# Patient Record
Sex: Female | Born: 1965 | Race: White | Hispanic: No | Smoking: Current every day smoker
Health system: Southern US, Community
[De-identification: ages and names within clinical notes are randomized; demographics above are authoritative.]

## PROBLEM LIST (undated history)

## (undated) DIAGNOSIS — F32A Depression, unspecified: Secondary | ICD-10-CM

## (undated) DIAGNOSIS — F419 Anxiety disorder, unspecified: Secondary | ICD-10-CM

## (undated) DIAGNOSIS — F329 Major depressive disorder, single episode, unspecified: Secondary | ICD-10-CM

## (undated) HISTORY — DX: Anxiety disorder, unspecified: F41.9

## (undated) HISTORY — DX: Depression, unspecified: F32.A

## (undated) HISTORY — DX: Major depressive disorder, single episode, unspecified: F32.9

## (undated) HISTORY — PX: APPENDECTOMY: SHX54

---

## 2000-02-29 ENCOUNTER — Emergency Department (HOSPITAL_COMMUNITY): Admission: EM | Admit: 2000-02-29 | Discharge: 2000-02-29 | Payer: Self-pay | Admitting: Emergency Medicine

## 2001-04-19 ENCOUNTER — Emergency Department (HOSPITAL_COMMUNITY): Admission: EM | Admit: 2001-04-19 | Discharge: 2001-04-19 | Payer: Self-pay | Admitting: Emergency Medicine

## 2010-04-14 ENCOUNTER — Ambulatory Visit: Payer: Self-pay | Admitting: Emergency Medicine

## 2010-04-14 DIAGNOSIS — J019 Acute sinusitis, unspecified: Secondary | ICD-10-CM

## 2010-04-16 ENCOUNTER — Telehealth (INDEPENDENT_AMBULATORY_CARE_PROVIDER_SITE_OTHER): Payer: Self-pay

## 2010-06-05 NOTE — Assessment & Plan Note (Signed)
Summary: EARS/SINUS INFECTION/ NH   Vital Signs:  Patient Profile:   45 Years Old Female CC:      ear ache Height:     62 inches Weight:      146 pounds O2 Sat:      99 % O2 treatment:    Room Air Temp:     98.6 degrees F oral Pulse rate:   96 / minute Resp:     16 per minute BP sitting:   125 / 76  (left arm) Cuff size:   regular  Vitals Entered By: Clemens Catholic LPN (April 14, 2010 2:40 PM)                  Updated Prior Medication List: AMOXICILLIN 875 MG TABS (AMOXICILLIN) 1 by mouth two times a day for 10 days TYLENOL 325 MG TABS (ACETAMINOPHEN) prn  Current Allergies (reviewed today): ! SULFAHistory of Present Illness Chief Complaint: ear ache History of Present Illness: Pt complains of 21 days of congestion.  With progressive colored mucus. No sore throat. No cough,  No nausea No vomiting. + fever, No chills. Other symptoms: bilateral earache, mild tinnnitus.Bitemporal h/a. Tylenol not helping. No sneezing.  No cough or dyspnea.  REVIEW OF SYSTEMS Constitutional Symptoms       Complains of chills and night sweats.     Denies fever, weight loss, weight gain, and fatigue.  Eyes       Complains of glasses and contact lenses.      Denies change in vision, eye pain, eye discharge, and eye surgery. Ear/Nose/Throat/Mouth       Complains of ear pain and sinus problems.      Denies hearing loss/aids, change in hearing, ear discharge, dizziness, frequent runny nose, frequent nose bleeds, sore throat, hoarseness, and tooth pain or bleeding.  Respiratory       Denies dry cough, productive cough, wheezing, shortness of breath, asthma, bronchitis, and emphysema/COPD.  Cardiovascular       Denies murmurs, chest pain, and tires easily with exhertion.    Gastrointestinal       Complains of nausea/vomiting and diarrhea.      Denies stomach pain, constipation, blood in bowel movements, and indigestion. Genitourniary       Denies painful urination, kidney stones, and  loss of urinary control. Neurological       Denies paralysis, seizures, and fainting/blackouts. Musculoskeletal       Denies muscle pain, joint pain, joint stiffness, decreased range of motion, redness, swelling, muscle weakness, and gout.  Skin       Denies bruising, unusual mles/lumps or sores, and hair/skin or nail changes.  Psych       Denies mood changes, temper/anger issues, anxiety/stress, speech problems, depression, and sleep problems. Other Comments: pt c/o bilateral ear ache, ears ringing, and HA x 1 mth. states her sinuses feel dry. she is taking tylenol as needed.   Past History:  Past Surgical History: Appendectomy Caesarean section  Family History: Family History Diabetes 1st degree relative  Social History: Current Smoker-1 PPD Alcohol use-no Drug use-no Smoking Status:  current Drug Use:  no  Allergies (verified): 1)  ! Sulfa  Physical Exam General appearance: well developed, well nourished, no acute distress Head: normocephalic, atraumatic, mild tenderness both temporalis mms Eyes: conjunctivae and lids normal Ears: inflamed right TM, mild wax in r canal.----L tm mildly red,inflammed. canal patent. Nasal: swollen red turbinates with congestion, mild yellow mucus Oral/Pharynx: tongue normal, posterior pharynx without erythema or exudate  Neck: neck supple,  trachea midline, no masses Chest/Lungs: no rales, wheezes, or rhonchi bilateral, breath sounds equal without effort Heart: regular rate and  rhythm, no murmur Skin: no obvious rashes or lesions Assessment New Problems: OTITIS MEDIA, PURULENT, ACUTE (ICD-382.00) ACUTE SINUSITIS, UNSPECIFIED (ICD-461.9) FAMILY HISTORY DIABETES 1ST DEGREE RELATIVE (ICD-V18.0)   Patient Education: Patient and/or caregiver instructed in the following: rest, fluids, rest fluids and Tylenol, quit smoking. Mucinex-D, 1 q am. Try 2 Alleve two times a day as needed pain.  Home humidification. Discussed safe home ear  irrigation.  Plan New Medications/Changes: AMOXICILLIN 875 MG TABS (AMOXICILLIN) 1 by mouth two times a day for 10 days  #20 x 0, 04/14/2010, Lajean Manes MD  New Orders: New Patient Level III [phone] Planning Comments:   I explained that she needs to establish with a PCP. Card given for [facility]-.  Follow Up: Follow up on an as needed basis, Follow up with Primary Physician Follow Up: I explained that she needs to establish with a PCP.  The patient and/or caregiver has been counseled thoroughly with regard to medications prescribed including dosage, schedule, interactions, rationale for use, and possible side effects and they verbalize understanding.  Diagnoses and expected course of recovery discussed and will return if not improved as expected or if the condition worsens. Patient and/or caregiver verbalized understanding.  Prescriptions: AMOXICILLIN 875 MG TABS (AMOXICILLIN) 1 by mouth two times a day for 10 days  #20 x 0   Entered and Authorized by:   Lajean Manes MD   Signed by:   Lajean Manes MD on 04/14/2010   Method used:   Print then Give to Patient   RxID:   [id number]   Orders Added: 1)  New Patient Level III [16010]

## 2010-06-05 NOTE — Progress Notes (Signed)
Summary: Courtesy Call  Phone Note Outgoing Call   Call placed by: Areta Haber CMA,  April 16, 2010 4:01 PM Summary of Call: Courtesy call to pt - Courtesy mess LOVM of hm number. Initial call taken by: Areta Haber CMA,  April 16, 2010 4:05 PM

## 2013-10-13 ENCOUNTER — Ambulatory Visit (INDEPENDENT_AMBULATORY_CARE_PROVIDER_SITE_OTHER): Payer: Commercial Managed Care - PPO | Admitting: Internal Medicine

## 2013-10-13 ENCOUNTER — Encounter: Payer: Self-pay | Admitting: Emergency Medicine

## 2013-10-13 VITALS — BP 124/70 | HR 84 | Temp 97.9°F | Resp 16 | Ht 62.0 in | Wt 128.6 lb

## 2013-10-13 DIAGNOSIS — F17200 Nicotine dependence, unspecified, uncomplicated: Secondary | ICD-10-CM | POA: Insufficient documentation

## 2013-10-13 DIAGNOSIS — Z0000 Encounter for general adult medical examination without abnormal findings: Secondary | ICD-10-CM

## 2013-10-13 DIAGNOSIS — Z23 Encounter for immunization: Secondary | ICD-10-CM

## 2013-10-13 DIAGNOSIS — F411 Generalized anxiety disorder: Secondary | ICD-10-CM | POA: Insufficient documentation

## 2013-10-13 LAB — POCT CBC
Granulocyte percent: 63 %G (ref 37–80)
HCT, POC: 42 % (ref 37.7–47.9)
HEMOGLOBIN: 13.9 g/dL (ref 12.2–16.2)
Lymph, poc: 1.7 (ref 0.6–3.4)
MCH: 32.6 pg — AB (ref 27–31.2)
MCHC: 33.1 g/dL (ref 31.8–35.4)
MCV: 98.5 fL — AB (ref 80–97)
MID (cbc): 0.4 (ref 0–0.9)
MPV: 8.7 fL (ref 0–99.8)
POC GRANULOCYTE: 3.7 (ref 2–6.9)
POC LYMPH %: 29.7 % (ref 10–50)
POC MID %: 7.3 %M (ref 0–12)
Platelet Count, POC: 343 10*3/uL (ref 142–424)
RBC: 4.26 M/uL (ref 4.04–5.48)
RDW, POC: 12.1 %
WBC: 5.8 10*3/uL (ref 4.6–10.2)

## 2013-10-13 NOTE — Progress Notes (Addendum)
This chart was scribed for Ellamae Siaobert Yevonne Yokum, MD by Joaquin MusicKristina Sanchez-Matthews, ED Scribe. This patient was seen in room Room/bed 11 and the patient's care was started at 2:33 PM. Subjective:    Patient ID: Charlene Gonzalez, female    DOB: 02/12/1966, 48 y.o.   MRN: [id number] Chief Complaint  Patient presents with  . Annual Exam    no pap   HPI Charlene Gonzalez is a 48 y.o. female [facility]. She states she does smoke cigarettes, has patches at home and states she has attempted to quit by calling a quit smoking phone line but did not have a call back; she denies having motivation which is the reason for her to stop smoking. She reports having stress alleviated upon having her youngest 18yo child to move-out of her home.   FH=Mother died from a cardiac aneurism and was determined with a CXR while being diagnosed with pneumonia. Father was overweight and had heart disease. Family hx of Vitamin D deficiency; mother, sisters, and daughters. PMH= Pt denies taking daily medications, hx of surgeries, and having medical problems such as DM, asthma, and HTN. She c/o frequent HA and states she has a hx of TMJ and family hx of migraines.  States she still has normal menstrual cycles. Last normal pap was 18 years ago. She would like to defer Pap and breast exam today as she is anxious about doctor visits-she will make an appointment for followup  Last tetanus: about 15 years ago.   PSH-Appendectomy durning childhood.  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  Patient Active Problem List   Diagnosis Date Noted  . ACUTE SINUSITIS, UNSPECIFIED 04/14/2010   No current outpatient prescriptions on file.   Review of Systems  Allergic/Immunologic: Positive for environmental allergies.  Neurological: Positive for headaches.  Psychiatric/Behavioral: The patient is nervous/anxious.        She has been tried on many medications for her long history of  anxiety and would prefer not to use medications ever again  All other systems reviewed and are negative.   Objective:   Physical Exam  Nursing note and vitals reviewed. Constitutional: She is oriented to person, place, and time. She appears well-developed and well-nourished. No distress.  HENT:  Head: Normocephalic and atraumatic.  Right Ear: External ear normal.  Left Ear: External ear normal.  Nose: Nose normal.  Mouth/Throat: Oropharynx is clear and moist.  Eyes: Conjunctivae and EOM are normal. Pupils are equal, round, and reactive to light.  Neck: Normal range of motion. Neck supple. No thyromegaly present.  Cardiovascular: Normal rate, regular rhythm and normal heart sounds.   No murmur heard. Pulmonary/Chest: Effort normal and breath sounds normal. No respiratory distress.  Abdominal: Soft. Bowel sounds are normal. She exhibits no distension and no mass. There is no tenderness. There is no rebound and no guarding.  Musculoskeletal: Normal range of motion. She exhibits no edema and no tenderness.  Lymphadenopathy:    She has no cervical adenopathy.  Neurological: She is alert and oriented to person, place, and time. She has normal reflexes. No cranial nerve deficit.  Skin: Skin is warm and dry.  Psychiatric: She has a normal mood and affect. Her behavior is normal. Judgment and thought content normal.  She does have some mild anxiety with this current situation but is able to discuss things easily    Triage Vitals:BP 124/70  Pulse 84  Temp(Src) 97.9 F (36.6 C) (Oral)  Resp 16  Ht  5\' 2"  (1.575 m)  Wt 128 lb 9.6 oz (58.333 kg)  BMI 23.52 kg/m2  SpO2 99%  LMP 10/08/2013  Results for orders placed in visit on 10/13/13  POCT CBC      Result Value Ref Range   WBC 5.8  4.6 - 10.2 K/uL   Lymph, poc 1.7  0.6 - 3.4   POC LYMPH PERCENT 29.7  10 - 50 %L   MID (cbc) 0.4  0 - 0.9   POC MID % 7.3  0 - 12 %M   POC Granulocyte 3.7  2 - 6.9   Granulocyte percent 63.0  37 - 80 %G     RBC 4.26  4.04 - 5.48 M/uL   Hemoglobin 13.9  12.2 - 16.2 g/dL   HCT, POC 40.942.0  81.137.7 - 47.9 %   MCV 98.5 (*) 80 - 97 fL   MCH, POC 32.6 (*) 27 - 31.2 pg   MCHC 33.1  31.8 - 35.4 g/dL   RDW, POC 91.412.1     Platelet Count, POC 343  142 - 424 K/uL   MPV 8.7  0 - 99.8 fL   Assessment & Plan:    I have completed the patient encounter in its entirety as documented by the scribe, with editing by me where necessary. Taj Nevins P. Merla Richesoolittle, M.D. Routine general medical examination at a health care facility - Plan: POCT CBC, Lipid panel, Comprehensive metabolic panel, TSH  Nicotine addiction - Plan: POCT CBC, Lipid panel, Comprehensive metabolic panel, TSH  Anxiety state, unspecified  Discussed her problems with smoking, now more than a pack a day. Describe gradual cessation method over a longer period of time versus complete cessation with the use of nicotine supplements and she chooses the latter. She is referred to her yoga for her anxiety and her muscle tension including TMJ She is referred to Dr. Grant FontanaMilan for therapy (send address with labs)

## 2013-10-14 LAB — COMPREHENSIVE METABOLIC PANEL
ALBUMIN: 4.2 g/dL (ref 3.5–5.2)
AST: 11 U/L (ref 0–37)
Alkaline Phosphatase: 62 U/L (ref 39–117)
BUN: 12 mg/dL (ref 6–23)
CHLORIDE: 106 meq/L (ref 96–112)
CO2: 25 mEq/L (ref 19–32)
Calcium: 8.9 mg/dL (ref 8.4–10.5)
Creat: 0.58 mg/dL (ref 0.50–1.10)
Glucose, Bld: 84 mg/dL (ref 70–99)
POTASSIUM: 4.7 meq/L (ref 3.5–5.3)
Sodium: 139 mEq/L (ref 135–145)
Total Bilirubin: 0.6 mg/dL (ref 0.2–1.2)
Total Protein: 6.5 g/dL (ref 6.0–8.3)

## 2013-10-14 LAB — LIPID PANEL
Cholesterol: 187 mg/dL (ref 0–200)
HDL: 49 mg/dL
LDL Cholesterol: 126 mg/dL — ABNORMAL HIGH (ref 0–99)
Total CHOL/HDL Ratio: 3.8 ratio
Triglycerides: 62 mg/dL
VLDL: 12 mg/dL (ref 0–40)

## 2013-10-14 LAB — TSH: TSH: 0.602 u[IU]/mL (ref 0.350–4.500)

## 2013-10-16 ENCOUNTER — Encounter: Payer: Self-pay | Admitting: Internal Medicine

## 2015-05-31 ENCOUNTER — Emergency Department (HOSPITAL_COMMUNITY): Payer: Commercial Managed Care - PPO

## 2015-05-31 ENCOUNTER — Encounter (HOSPITAL_COMMUNITY): Payer: Self-pay | Admitting: Family Medicine

## 2015-05-31 ENCOUNTER — Emergency Department (HOSPITAL_COMMUNITY)
Admission: EM | Admit: 2015-05-31 | Discharge: 2015-05-31 | Disposition: A | Discharge status: Home / Self Care | Payer: Commercial Managed Care - PPO | Attending: Emergency Medicine | Admitting: Emergency Medicine

## 2015-05-31 DIAGNOSIS — Z880 Allergy status to penicillin: Secondary | ICD-10-CM | POA: Insufficient documentation

## 2015-05-31 DIAGNOSIS — F17200 Nicotine dependence, unspecified, uncomplicated: Secondary | ICD-10-CM | POA: Insufficient documentation

## 2015-05-31 DIAGNOSIS — Z8659 Personal history of other mental and behavioral disorders: Secondary | ICD-10-CM | POA: Insufficient documentation

## 2015-05-31 DIAGNOSIS — M25562 Pain in left knee: Secondary | ICD-10-CM | POA: Diagnosis present

## 2015-05-31 MED ORDER — TRAMADOL HCL 50 MG PO TABS: 50.0000 mg | ORAL_TABLET | Freq: Four times a day (QID) | ORAL | Status: DC | PRN

## 2015-05-31 MED ORDER — NAPROXEN 500 MG PO TABS: 500.0000 mg | ORAL_TABLET | Freq: Two times a day (BID) | ORAL | Status: DC

## 2015-05-31 NOTE — ED Notes (Signed)
See PAs notes for secondary assessment.  

## 2015-05-31 NOTE — Discharge Instructions (Signed)
Naprosyn for pain and inflammation. Tramadol for severe pain. Keep knee elevated. Ice several times a day. Follow up with orthopedics.   Knee Pain Knee pain is a very common symptom and can have many causes. Knee pain often goes away when you follow your health care provider's instructions for relieving pain and discomfort at home. However, knee pain can develop into a condition that needs treatment. Some conditions may include:  Arthritis caused by wear and tear (osteoarthritis).  Arthritis caused by swelling and irritation (rheumatoid arthritis or gout).  A cyst or growth in your knee.  An infection in your knee joint.  An injury that will not heal.  Damage, swelling, or irritation of the tissues that support your knee (torn ligaments or tendinitis). If your knee pain continues, additional tests may be ordered to diagnose your condition. Tests may include X-rays or other imaging studies of your knee. You may also need to have fluid removed from your knee. Treatment for ongoing knee pain depends on the cause, but treatment may include:  Medicines to relieve pain or swelling.  Steroid injections in your knee.  Physical therapy.  Surgery. HOME CARE INSTRUCTIONS  Take medicines only as directed by your health care provider.  Rest your knee and keep it raised (elevated) while you are resting.  Do not do things that cause or worsen pain.  Avoid high-impact activities or exercises, such as running, jumping rope, or doing jumping jacks.  Apply ice to the knee area:  Put ice in a plastic bag.  Place a towel between your skin and the bag.  Leave the ice on for 20 minutes, 2-3 times a day.  Ask your health care provider if you should wear an elastic knee support.  Keep a pillow under your knee when you sleep.  Lose weight if you are overweight. Extra weight can put pressure on your knee.  Do not use any tobacco products, including cigarettes, chewing tobacco, or electronic  cigarettes. If you need help quitting, ask your health care provider. Smoking may slow the healing of any bone and joint problems that you may have. SEEK MEDICAL CARE IF:  Your knee pain continues, changes, or gets worse.  You have a fever along with knee pain.  Your knee buckles or locks up.  Your knee becomes more swollen. SEEK IMMEDIATE MEDICAL CARE IF:   Your knee joint feels hot to the touch.  You have chest pain or trouble breathing.   This information is not intended to replace advice given to you by your health care provider. Make sure you discuss any questions you have with your health care provider.   Document Released: 02/15/2007 Document Revised: 05/11/2014 Document Reviewed: 12/04/2013 Elsevier Interactive Patient Education [employer]

## 2015-05-31 NOTE — ED Notes (Signed)
Pt here for left knee pain. Sts she stood up and "it blew out". Denies injury

## 2015-05-31 NOTE — ED Provider Notes (Signed)
CSN: [id number]     Arrival date & time 05/31/15  1608 History  By signing my name below, I, Placido Sou, attest that this documentation has been prepared under the direction and in the presence of Orphia Mctigue, PA-C. Electronically Signed: Placido Sou, ED Scribe. 05/31/2015. 5:51 PM.    Chief Complaint  Patient presents with  . Knee Pain   The history is provided by the patient. No language interpreter was used.    HPI Comments: Charlene Gonzalez is a 50 y.o. female who presents to the Emergency Department complaining of constant, mild, diffuse, shooting, left knee pain with onset this morning. She reports that she stood up from a sitting position with pain beginning suddenly in the affected joint. Pt notes applying ice to the affected joint which provided mild relief. Her pain worsens at the bottom of her knee with movement of the left leg. Pt notes a hx of left meniscus tear ~5 years ago and refused treatment further noting she wore a knee immobilizer until her pain subsided. She is unsure if her current pain is similar to her past injury. She denies any falls or any other associated symptoms at this time.    Past Medical History  Diagnosis Date  . Anxiety   . Depression    Past Surgical History  Procedure Laterality Date  . Appendectomy    . Cesarean section     Family History  Problem Relation Age of Onset  . Diabetes Brother    Social History  Substance Use Topics  . Smoking status: Current Every Day Smoker  . Smokeless tobacco: None  . Alcohol Use: No   OB History    No data available     Review of Systems  Musculoskeletal: Positive for joint swelling and arthralgias.  Skin: Negative for wound.  Neurological: Negative for weakness and numbness.    Allergies  Penicillins and Sulfonamide derivatives  Home Medications   Prior to Admission medications   Not on File   BP 141/59 mmHg  Pulse 98  Temp(Src) 98.2 F (36.8 C) (Oral)  Resp 18  Ht   (1.575 m)  Wt 138 lb (62.596 kg)  BMI 25.23 kg/m2  SpO2 100%  LMP 05/31/2015     Physical Exam  Constitutional: She is oriented to person, place, and time. She appears well-developed and well-nourished.  HENT:  Head: Normocephalic and atraumatic.  Eyes: EOM are normal.  Neck: Normal range of motion.  Cardiovascular: Normal rate.   Pulmonary/Chest: Effort normal. No respiratory distress.  Abdominal: Soft.  Musculoskeletal: Normal range of motion.  Left knee normal appearing. ttp over medial joint. Full rom of the knee. Negative anterior and posterior drawer signs. No laxity with medial or lateral stress. Pain with appley's maneuver   Neurological: She is alert and oriented to person, place, and time.  Skin: Skin is warm and dry.  Psychiatric: She has a normal mood and affect.  Nursing note and vitals reviewed.   ED Course  Procedures  DIAGNOSTIC STUDIES: Oxygen Saturation is 100% on RA, normal by my interpretation.    COORDINATION OF CARE: 5:51 PM Pt presents today due to left knee pain. Discussed imaging results and treatment plan. Pt agreed to plan.  Labs Review Labs Reviewed - No data to display  Imaging Review Dg Knee Complete 4 Views Left  05/31/2015  CLINICAL DATA:  Anterior left knee pain and swelling. Onset of pain today after standing up. Initial encounter. EXAM: LEFT KNEE - COMPLETE 4+ VIEW  COMPARISON:  None. FINDINGS: There is no evidence of fracture, dislocation, or joint effusion. There is no evidence of arthropathy or other focal bone abnormality. Soft tissues are unremarkable. IMPRESSION: Negative exam. Electronically Signed   By: Drusilla Kanner M.D.   On: 05/31/2015 17:04   I have personally reviewed and evaluated these images as part of my medical decision-making.   EKG Interpretation None      MDM   Final diagnoses:  Left knee pain   Patient was admitted onset of left knee pain after stepping on it this afternoon. Reports history of meniscus tear  4/5 years ago. Neurovascularly intact. Pain worsened with bearing weight, movement. No signs of ligamentous injury based on exam. X-rays negative. Question worsening meniscal tear. We'll place an immobilizer. Naproxen and tramadol for pain. Follow-up with orthopedic specialist.  Filed Vitals:   05/31/15 1625 05/31/15 1805  BP: 141/59 136/74  Pulse: 98 96  Temp: 98.2 F (36.8 C)   TempSrc: Oral   Resp: 18 16  Height:  (1.575 m)   Weight: 62.596 kg   SpO2: 100% 99%   I personally performed the services described in this documentation, which was scribed in my presence. The recorded information has been reviewed and is accurate.   Jaynie Crumble, PA-C 05/31/15 1837  Rolland Porter, MD 06/06/15 Aretha Parrot

## 2016-01-25 ENCOUNTER — Ambulatory Visit (INDEPENDENT_AMBULATORY_CARE_PROVIDER_SITE_OTHER): Payer: Commercial Managed Care - PPO | Admitting: Physician Assistant

## 2016-01-25 VITALS — BP 122/72 | HR 92 | Temp 98.5°F | Resp 17 | Ht 62.5 in | Wt 136.0 lb

## 2016-01-25 DIAGNOSIS — B078 Other viral warts: Secondary | ICD-10-CM

## 2016-01-25 DIAGNOSIS — B070 Plantar wart: Secondary | ICD-10-CM | POA: Diagnosis not present

## 2016-01-25 DIAGNOSIS — J0100 Acute maxillary sinusitis, unspecified: Secondary | ICD-10-CM | POA: Diagnosis not present

## 2016-01-25 DIAGNOSIS — J309 Allergic rhinitis, unspecified: Secondary | ICD-10-CM

## 2016-01-25 MED ORDER — FLUTICASONE PROPIONATE 50 MCG/ACT NA SUSP: 2.0000 | Freq: Every day | NASAL | 6 refills | Status: AC

## 2016-01-25 MED ORDER — CLARITHROMYCIN 500 MG PO TABS: 500.0000 mg | ORAL_TABLET | Freq: Two times a day (BID) | ORAL | 0 refills | Status: AC
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
## 2016-01-25 NOTE — Patient Instructions (Addendum)
  Start mucinex blue box to help thin out the nasal secretions  Wart stick - you have to use daily it will peel off the skin and the wart  IF you received an x-ray today, you will receive an invoice from [facility]. Please contact [facility] at [phone] with questions or concerns regarding your invoice.   IF you received labwork today, you will receive an invoice from [facility]. Please contact [facility] at [phone] with questions or concerns regarding your invoice.   Our billing staff will not be able to assist you with questions regarding bills from these companies.  You will be contacted with the lab results as soon as they are available. The fastest way to get your results is to activate your My Chart account. Instructions are located on the last page of this paperwork. If you have not heard from us regarding the results in 2 weeks, please contact this office.

## 2016-03-05 ENCOUNTER — Other Ambulatory Visit: Payer: Self-pay | Admitting: Physical Medicine and Rehabilitation

## 2016-03-05 DIAGNOSIS — M542 Cervicalgia: Secondary | ICD-10-CM

## 2016-03-05 DIAGNOSIS — M545 Low back pain: Secondary | ICD-10-CM

## 2016-03-21 ENCOUNTER — Ambulatory Visit
Admission: RE | Admit: 2016-03-21 | Discharge: 2016-03-21 | Disposition: A | Discharge status: Home / Self Care | Payer: Commercial Managed Care - PPO | Source: Ambulatory Visit | Attending: Physical Medicine and Rehabilitation | Admitting: Physical Medicine and Rehabilitation

## 2016-03-21 DIAGNOSIS — M545 Low back pain: Secondary | ICD-10-CM

## 2016-03-21 DIAGNOSIS — M542 Cervicalgia: Secondary | ICD-10-CM

## 2016-04-01 ENCOUNTER — Ambulatory Visit
Admission: RE | Admit: 2016-04-01 | Discharge: 2016-04-01 | Disposition: A | Discharge status: Home / Self Care | Payer: Commercial Managed Care - PPO | Source: Ambulatory Visit | Attending: Chiropractic Medicine | Admitting: Chiropractic Medicine

## 2016-04-01 ENCOUNTER — Other Ambulatory Visit: Payer: Self-pay | Admitting: Chiropractic Medicine

## 2016-04-01 DIAGNOSIS — S134XXA Sprain of ligaments of cervical spine, initial encounter: Secondary | ICD-10-CM

## 2016-04-01 DIAGNOSIS — S335XXA Sprain of ligaments of lumbar spine, initial encounter: Secondary | ICD-10-CM

## 2016-04-01 DIAGNOSIS — S233XXA Sprain of ligaments of thoracic spine, initial encounter: Secondary | ICD-10-CM

## 2016-04-01 DIAGNOSIS — M546 Pain in thoracic spine: Secondary | ICD-10-CM

## 2016-04-01 DIAGNOSIS — M545 Low back pain: Secondary | ICD-10-CM

## 2016-04-01 DIAGNOSIS — M542 Cervicalgia: Secondary | ICD-10-CM

## 2016-06-19 ENCOUNTER — Encounter (INDEPENDENT_AMBULATORY_CARE_PROVIDER_SITE_OTHER): Payer: Self-pay

## 2016-06-19 ENCOUNTER — Other Ambulatory Visit (HOSPITAL_COMMUNITY): Payer: Self-pay | Admitting: Specialist

## 2016-06-19 ENCOUNTER — Ambulatory Visit (HOSPITAL_COMMUNITY)
Admission: RE | Admit: 2016-06-19 | Discharge: 2016-06-19 | Disposition: A | Discharge status: Home / Self Care | Payer: Commercial Managed Care - PPO | Source: Ambulatory Visit | Attending: Specialist | Admitting: Specialist

## 2016-06-19 DIAGNOSIS — M7122 Synovial cyst of popliteal space [Baker], left knee: Secondary | ICD-10-CM | POA: Insufficient documentation

## 2016-06-19 DIAGNOSIS — M79605 Pain in left leg: Secondary | ICD-10-CM | POA: Diagnosis not present

## 2016-06-19 DIAGNOSIS — S83512D Sprain of anterior cruciate ligament of left knee, subsequent encounter: Secondary | ICD-10-CM | POA: Insufficient documentation

## 2016-06-19 DIAGNOSIS — M7989 Other specified soft tissue disorders: Secondary | ICD-10-CM | POA: Diagnosis not present

## 2016-06-19 NOTE — Progress Notes (Signed)
*  PRELIMINARY RESULTS* Vascular Ultrasound Left lower extremity venous duplex has been completed.  Preliminary findings: no evidence of DVT. Small left baker's cyst noted.   Called results to Dr. Thomasena Edisollins office. Patient ok to leave and follow up with MD next week.    Farrel DemarkJill Eunice, RDMS, RVT  06/19/2016, 2:17 PM
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
## 2021-12-09 ENCOUNTER — Ambulatory Visit (HOSPITAL_COMMUNITY)
Admission: AD | Admit: 2021-12-09 | Discharge: 2021-12-09 | Disposition: A | Discharge status: Home / Self Care | Payer: Self-pay | Attending: Psychiatry | Admitting: Psychiatry

## 2021-12-09 DIAGNOSIS — F4310 Post-traumatic stress disorder, unspecified: Secondary | ICD-10-CM | POA: Insufficient documentation
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
